# Patient Record
Sex: Female | Born: 1960 | Race: White | Hispanic: No | Marital: Married | Smoking: Never smoker
Health system: Southern US, Community
[De-identification: ages and names within clinical notes are randomized; demographics above are authoritative.]

## PROBLEM LIST (undated history)

## (undated) DIAGNOSIS — K37 Unspecified appendicitis: Secondary | ICD-10-CM

## (undated) HISTORY — PX: APPENDECTOMY: SHX54

---

## 1997-08-23 ENCOUNTER — Other Ambulatory Visit: Admission: RE | Admit: 1997-08-23 | Discharge: 1997-08-23 | Payer: Self-pay | Admitting: Obstetrics and Gynecology

## 1999-02-26 ENCOUNTER — Encounter: Payer: Self-pay | Admitting: Family Medicine

## 1999-02-26 ENCOUNTER — Ambulatory Visit (HOSPITAL_COMMUNITY): Admission: RE | Admit: 1999-02-26 | Discharge: 1999-02-26 | Payer: Self-pay | Admitting: Family Medicine

## 1999-03-10 ENCOUNTER — Ambulatory Visit (HOSPITAL_COMMUNITY): Admission: RE | Admit: 1999-03-10 | Discharge: 1999-03-10 | Payer: Self-pay | Admitting: *Deleted

## 1999-03-12 ENCOUNTER — Ambulatory Visit (HOSPITAL_COMMUNITY): Admission: RE | Admit: 1999-03-12 | Discharge: 1999-03-12 | Payer: Self-pay | Admitting: Neurology

## 1999-11-20 ENCOUNTER — Other Ambulatory Visit: Admission: RE | Admit: 1999-11-20 | Discharge: 1999-11-20 | Payer: Self-pay | Admitting: Obstetrics and Gynecology

## 2001-04-27 ENCOUNTER — Other Ambulatory Visit: Admission: RE | Admit: 2001-04-27 | Discharge: 2001-04-27 | Payer: Self-pay | Admitting: Obstetrics and Gynecology

## 2003-09-08 ENCOUNTER — Other Ambulatory Visit: Admission: RE | Admit: 2003-09-08 | Discharge: 2003-09-08 | Payer: Self-pay | Admitting: Obstetrics and Gynecology

## 2003-09-12 ENCOUNTER — Ambulatory Visit (HOSPITAL_COMMUNITY): Admission: RE | Admit: 2003-09-12 | Discharge: 2003-09-12 | Payer: Self-pay | Admitting: Neurology

## 2005-04-05 ENCOUNTER — Other Ambulatory Visit: Admission: RE | Admit: 2005-04-05 | Discharge: 2005-04-05 | Payer: Self-pay | Admitting: Obstetrics and Gynecology

## 2005-08-21 ENCOUNTER — Encounter: Payer: Self-pay | Admitting: *Deleted

## 2008-11-06 ENCOUNTER — Emergency Department (HOSPITAL_COMMUNITY): Admission: EM | Admit: 2008-11-06 | Discharge: 2008-11-07 | Payer: Self-pay | Admitting: Emergency Medicine

## 2008-11-09 ENCOUNTER — Ambulatory Visit: Payer: Self-pay | Admitting: Internal Medicine

## 2008-11-09 DIAGNOSIS — R1031 Right lower quadrant pain: Secondary | ICD-10-CM

## 2008-11-11 ENCOUNTER — Telehealth: Payer: Self-pay | Admitting: Internal Medicine

## 2008-11-11 ENCOUNTER — Encounter: Payer: Self-pay | Admitting: Internal Medicine

## 2008-11-11 LAB — CONVERTED CEMR LAB
Basophils Relative: 3 % (ref 0.0–3.0)
Eosinophils Absolute: 0.1 10*3/uL (ref 0.0–0.7)
HCT: 35.7 % — ABNORMAL LOW (ref 36.0–46.0)
Lymphs Abs: 1.2 10*3/uL (ref 0.7–4.0)
MCHC: 34.2 g/dL (ref 30.0–36.0)
MCV: 84.5 fL (ref 78.0–100.0)
Monocytes Absolute: 0.4 10*3/uL (ref 0.1–1.0)
Neutrophils Relative %: 81.7 % — ABNORMAL HIGH (ref 43.0–77.0)
Platelets: 220 10*3/uL (ref 150.0–400.0)
RBC: 4.22 M/uL (ref 3.87–5.11)

## 2008-11-14 ENCOUNTER — Encounter (INDEPENDENT_AMBULATORY_CARE_PROVIDER_SITE_OTHER): Payer: Self-pay | Admitting: General Surgery

## 2008-11-14 ENCOUNTER — Inpatient Hospital Stay (HOSPITAL_COMMUNITY): Admission: EM | Admit: 2008-11-14 | Discharge: 2008-11-18 | Payer: Self-pay | Admitting: Emergency Medicine

## 2008-12-05 ENCOUNTER — Telehealth: Payer: Self-pay | Admitting: Internal Medicine

## 2008-12-08 ENCOUNTER — Encounter: Payer: Self-pay | Admitting: Internal Medicine

## 2009-02-15 ENCOUNTER — Encounter: Admission: RE | Admit: 2009-02-15 | Discharge: 2009-02-15 | Payer: Self-pay | Admitting: Obstetrics and Gynecology

## 2010-02-23 ENCOUNTER — Encounter: Admission: RE | Admit: 2010-02-23 | Discharge: 2010-02-23 | Payer: Self-pay | Admitting: Obstetrics and Gynecology

## 2010-07-29 LAB — URINALYSIS, ROUTINE W REFLEX MICROSCOPIC
Bilirubin Urine: NEGATIVE
Glucose, UA: NEGATIVE mg/dL
Glucose, UA: NEGATIVE mg/dL
Hgb urine dipstick: NEGATIVE
Ketones, ur: NEGATIVE mg/dL
Protein, ur: 100 mg/dL — AB
Specific Gravity, Urine: 1.021 (ref 1.005–1.030)
Specific Gravity, Urine: 1.026 (ref 1.005–1.030)
Urobilinogen, UA: 2 mg/dL — ABNORMAL HIGH (ref 0.0–1.0)
pH: 5.5 (ref 5.0–8.0)

## 2010-07-29 LAB — DIFFERENTIAL
Eosinophils Absolute: 0.2 10*3/uL (ref 0.0–0.7)
Eosinophils Relative: 2 % (ref 0–5)
Lymphocytes Relative: 25 % (ref 12–46)
Lymphocytes Relative: 5 % — ABNORMAL LOW (ref 12–46)
Lymphs Abs: 0.7 10*3/uL (ref 0.7–4.0)
Lymphs Abs: 2.5 10*3/uL (ref 0.7–4.0)
Monocytes Absolute: 0.7 10*3/uL (ref 0.1–1.0)
Monocytes Relative: 5 % (ref 3–12)
Monocytes Relative: 7 % (ref 3–12)
Neutro Abs: 12.6 10*3/uL — ABNORMAL HIGH (ref 1.7–7.7)
Neutrophils Relative %: 90 % — ABNORMAL HIGH (ref 43–77)

## 2010-07-29 LAB — ANAEROBIC CULTURE

## 2010-07-29 LAB — LIPASE, BLOOD: Lipase: 14 U/L (ref 11–59)

## 2010-07-29 LAB — COMPREHENSIVE METABOLIC PANEL
ALT: 14 U/L (ref 0–35)
AST: 23 U/L (ref 0–37)
Albumin: 3.7 g/dL (ref 3.5–5.2)
CO2: 22 mEq/L (ref 19–32)
CO2: 26 mEq/L (ref 19–32)
Calcium: 8.4 mg/dL (ref 8.4–10.5)
Calcium: 8.9 mg/dL (ref 8.4–10.5)
Creatinine, Ser: 0.89 mg/dL (ref 0.4–1.2)
GFR calc Af Amer: >60 mL/min (ref >60)
GFR calc non Af Amer: >60 mL/min (ref >60)
GFR calc non Af Amer: >60 mL/min (ref >60)
Glucose, Bld: 112 mg/dL — ABNORMAL HIGH (ref 70–99)
Sodium: 137 mEq/L (ref 135–145)
Sodium: 138 mEq/L (ref 135–145)
Total Protein: 6.6 g/dL (ref 6.0–8.3)
Total Protein: 6.9 g/dL (ref 6.0–8.3)

## 2010-07-29 LAB — URINE MICROSCOPIC-ADD ON

## 2010-07-29 LAB — CBC
Hemoglobin: 14.2 g/dL (ref 12.0–15.0)
MCHC: 32.2 g/dL (ref 30.0–36.0)
MCHC: 33.2 g/dL (ref 30.0–36.0)
MCV: 85.3 fL (ref 78.0–100.0)
Platelets: 228 10*3/uL (ref 150–400)
RBC: 4.6 MIL/uL (ref 3.87–5.11)
RBC: 5.18 MIL/uL — ABNORMAL HIGH (ref 3.87–5.11)
RDW: 14.6 % (ref 11.5–15.5)

## 2010-07-29 LAB — WOUND CULTURE

## 2010-09-04 NOTE — Op Note (Signed)
NAMECHEVELLE, Dorothy Rhodes              ACCOUNT NO.:  [id number]   MEDICAL RECORD NO.:  [id number]          PATIENT TYPE:  INP   LOCATION:  0098                         FACILITY:  [facility]   PHYSICIAN:  Angelia Mould. Derrell Lolling, M.D.DATE OF BIRTH:  1960/05/16   DATE OF PROCEDURE:  11/14/2008  DATE OF DISCHARGE:                               OPERATIVE REPORT   PREOPERATIVE DIAGNOSES:  Ruptured appendicitis with peritonitis.   POSTOPERATIVE DIAGNOSES:  Ruptured appendicitis with peritonitis,  periappendiceal abscess.   OPERATION PERFORMED:  Laparoscopic appendectomy, drainage of  periappendiceal abscess.   SURGEON:  Dr. Claud Kelp.   OPERATIVE INDICATIONS:  This is a healthy 50 year old Caucasian female  who has had abdominal pain for 7 days.  She had seen three different  physicians during that period of time.  She has been on antibiotics for  a presumed urinary tract infection for 48 hours and her mid and lower  abdominal pain has gotten worse.  She came to the emergency room and a  CT scan was done which showed extensive ascites and a pelvic  inflammatory process in the right lower quadrant.  There was too much  inflammation for the radiologist to make a clear-cut diagnosis, but they  were suspicious of ruptured appendicitis.  On exam, she has tenderness  and guarding throughout the entire lower abdomen, some tachycardia and a  white blood cell count of 14,000.  She is brought to the operating room  urgently.   OPERATIVE FINDINGS:  The patient had acute ruptured appendicitis with  peritonitis.  She had extensive ascites throughout the entire abdomen  with cloudy brownish green fluid which was cultured.  Once we had  separated the loops of terminal ileum away from the appendix, there was  a small abscess with tan creamy pus in the area.  This abscess cavity  was not well formed, but a clear-cut walled off abscess.  The terminal  ileum looked normal for about the last 3-4 feet other than  some  secondary inflammation.  The uterus was slightly enlarged and had some  small fibroids in the wall.  Both tubes and ovaries looked fine.  The  sigmoid colon looked fine.  The liver and gallbladder looked fine.  The  stomach looked fine.  The spleen looked fine.   OPERATIVE TECHNIQUE:  Following the induction of general endotracheal  anesthesia, a Foley catheter was inserted.  Intravenous antibiotics had  been given prior to induction of anesthesia which included gentamicin  and cefoxitin.  The abdomen was prepped and draped in a sterile fashion.  A surgical timeout was held identifying the correct patient and correct  procedure.  Marcaine 0.5% with epinephrine was used as a local  infiltration anesthetic.   A vertically oriented incision was made in the upper rim of the  umbilicus in the midline.  The fascia was incised in the midline and the  abdominal cavity entered under direct vision.  An 11-mm Hassan trocar  was inserted and secured with a pursestring suture of 0 Vicryl.  Pneumoperitoneum was created.  The patient was positioned in  Trendelenburg.  A 12-mm  trocar was placed in the left suprapubic area  and a 5-mm trocar placed in the left lower quadrant and ultimately I had  to place a 5-mm trocar in the right upper quadrant for exposure and  irrigation.   We suctioned out pelvic fluid and fluid in the subphrenic space and  paracolic space.  We slowly teased loops of the terminal ileum away from  the cecum until we could see part of the inflamed appendix.  We very  gently teased the small bowel away and we ultimately entered an  abscessed cavity with tan creamy pus which was evacuated.  We looked at  the rest of the terminal ileum and saw no other primary disease process.  The uterus, fallopian tubes and ovaries looked fine.  We directed our  attention back to the cecum, terminal ileum and inflamed appendix.  As  we mobilized the appendix it became more clear of the  anatomy.  We  slowly teased the last bit of the terminal ileum away from the appendix  and then we could slowly began to dissect the mesentery of the appendix.  We isolated small bits of the appendiceal mesentery by going around it  with a right angle clamp and then dividing with harmonic scalpel.  We  did this in numerous steps until we almost had the appendix completely  skeletonized.  We found the appendiceal artery near the base of the  appendix and we got around that and divided it with the harmonic scalpel  as well.  At this point, we could see the entire appendix and where it  inserted onto the base of the cecum.  There did not appear to be any  injury to the cecum or the terminal ileum.  We placed an Endo-GIA  stapler across the base of the appendix with a 3.5 mm staple load.  After closing this, we held it in place for about 30 seconds, fired and  removed it.  The appendix was placed in a specimen bag and removed.  We  inspected the staple line several times and it looked very good.   We then spent a good amount of time irrigating about 3000 mL of fluid in  the pelvis, paracolic gutters, between loops of small bowel and the  subphrenic spaces and the subhepatic space.  We did this until the fluid  was completely clear.  The small bowel loops were completely free.  We  did not feel like we had left any loops adherent to each other.  We got  all the fluid out.  We checked the appendix closure one more time and it  looked good.  We brought the omentum down and tucked it onto the staple  line.  We evacuated the rest of the fluids.  We released the  pneumoperitoneum and removed all the trocars.  The fascia at the  umbilicus and the fascia at the suprapubic 12-mm trocar site were closed  with 0 Vicryl sutures.  The skin incisions were closed with subcuticular  sutures of 4-0 Monocryl and Steri-Strips.  Clean bandages were placed  and the patient taken to the recovery room in stable  condition.  Estimated blood loss was about 15 mL.  Complications none.  Sponge,  needle and instrument counts were correct.      Angelia Mould. Derrell Lolling, M.D.  Electronically Signed     HMI/MEDQ  D:  11/14/2008  T:  11/14/2008  Job:  [id number]   cc:   Miguel Aschoff, M.D.

## 2010-09-04 NOTE — H&P (Signed)
Dorothy Rhodes, Dorothy Rhodes              ACCOUNT NO.:  [id number]   MEDICAL RECORD NO.:  [id number]          PATIENT TYPE:  INP   LOCATION:  0098                         FACILITY:  [facility]   PHYSICIAN:  Angelia Mould. Derrell Lolling, M.D.DATE OF BIRTH:  1960-08-31   DATE OF ADMISSION:  11/14/2008  DATE OF DISCHARGE:                              HISTORY & PHYSICAL   CHIEF COMPLAINT:  Abdominal pain.   HISTORY OF PRESENT ILLNESS:  This is a healthy 50 year old Caucasian  female who was well until 7 days ago.  At that time, she developed some  periumbilical crampy gas pains.  She took Gas-X, it did not help.  She  went to the [facility] ER on November 07, 2008, complaining of the pain,  anorexia and nausea.  She denied vomiting or diarrhea, but had, had a  little bit of a low grade fever.  She says they told her she had an  ulcer and gave her a prescription for Prilosec and Phenergan.   The pain, anorexia and mild nausea continued.  She says she has had low-  grade fevers off and on and has had some chills.  She saw Dr. Stan Head of [facility] on Wednesday, November 09, 2008.  He initially thought  she had some type of musculoskeletal complaint, but she says that he  told her she had a slightly elevated white blood cell count.  He gave  her a prescription for hydrocodone and naproxen.   Her symptoms continued and on Friday, November 11, 2008, she thought she saw  some blood in her urine and she went to [facility].  She says  they gave her a prescription for Cipro and told her she had a urinary  tract infection.   Today, the pain persists, is more in the lower abdomen.  She says more  right than left-sided, but actually hurts all the way across.  It is  steady in Editor, commissioning.  She had a bowel movement yesterday.  She had no  prior similar episodes.   She came to the [facility] where they found that  her white blood cell count was 14,000.  CT scan showed an inflammatory  pelvic  process, possibly enlarged appendix, extensive ascites, uterine  fibroids, possibility of ruptured appendicitis was stated.  It should be  noted she had an ultrasound of the abdomen on November 06, 2008, which  showed only a cystic process in the liver.  She is admitted for further  evaluation and management.   PAST HISTORY:  Diagnosed multiple sclerosis on MRI a few years ago.  She  is asymptomatic, initially saw Dr. Lesia Sago and initially treated  but on no medications for 5-6 years.  Last menstrual period was normal  on October 27, 2008.  She has not had any prior medical or surgical  problems otherwise.  Fibroids are noted on CT tonight.   CURRENT MEDICATIONS:  She is on no chronic medications, but recently has  taken hydrocodone, Cipro, naproxen, Prilosec and Zofran.   ALLERGIES:  NONE KNOWN.   SOCIAL HISTORY:  Married  with two children.  Denies tobacco.  Drinks  alcohol rarely.  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   FAMILY HISTORY:  Mother living with hypertension, diabetes and coronary  artery disease.  Father living with hypertension and kidney stones.  Grandfather had colon cancer.  There is specifically no family history  of Crohn's disease, ulcerative colitis or chronic GI disease.   REVIEW OF SYSTEMS:  A 10-system review of systems is performed and is  noncontributory except as described above.   PHYSICAL EXAMINATION:  GENERAL:  A healthy-appearing pleasant woman in  mild distress.  VITAL SIGNS:  Temperature 98.7, respiratory rate 16, blood pressure  110/79.  HEENT:  Eyes - sclerae clear.  Extraocular movements intact.  Ears,  nose, mouth, throat, nose, lips, tongue and oropharynx are without gross  lesions.  NECK:  Supple, nontender.  No mass, no thyromegaly, no jugulovenous  distention.  LUNGS:  Clear to auscultation.  No chest wall tenderness.  No CVA  tenderness.  HEART:  Regular rate and rhythm.  Slightly tachycardic.  There may be a   very faint systolic murmur.  Radial and femoral pulses are palpable.  BREASTS:  Not examined.  ABDOMEN:  Perhaps slightly distended in the lower abdomen, very  hypoactive bowel sounds.  Upper abdomen is fairly soft, but the entire  lower abdomen is tender with involuntary guarding.  I do not see any  scars.  I do not see any hernias.  I do not feel any masses.  There is  no groin mass or adenopathy.  EXTREMITIES:  She moves all four  extremities without pain or deformity.  NEUROLOGIC:  No gross motor or sensory deficits.   ASSESSMENT:  1. Acute abdominal pain.  Most likely diagnosis is ruptured      appendicitis with lower abdominal peritonitis.  Other      considerations would be Meckel's diverticulitis, sigmoid      diverticulitis, other inflammatory condition of the small bowel or      cecum.  I think she needs urgent operative intervention for      diagnosis and treatment.  2. Asymptomatic multiple sclerosis.   PLAN:  1. The patient is started on IV fluids and IV antibiotics.  2. She will be taken to the operating room for laparoscopy and      possible laparotomy for management and diagnosis.   I explained to her that we might have to make a big incision.  I  explained to her that there was a remote possibility we might have to do  a colostomy.   I discussed the indication and details of the surgery with her and her  husband.  The risks and complications have been outlined, including but  not limited to bleeding, infection, open laparotomy, wound problems such  as infection or hernia, injury to adjacent organs such as the intestine  or bladder with major reconstructive surgery, cardiac, pulmonary and  thromboembolic problems.  She seems to understand these issues well.  At  this time, all questions are answered.  She is in full agreement with  this plan.      Angelia Mould. Derrell Lolling, M.D.  Electronically Signed     HMI/MEDQ  D:  11/14/2008  T:  11/14/2008  Job:  [id number]    cc:   Miguel Aschoff, M.D.

## 2010-09-07 NOTE — Discharge Summary (Signed)
Dorothy Rhodes, Dorothy Rhodes              ACCOUNT NO.:  [id number]   MEDICAL RECORD NO.:  [id number]           PATIENT TYPE:   LOCATION:                                 FACILITY:   PHYSICIAN:  Angelia Mould. Derrell Lolling, M.D.DATE OF BIRTH:  1961/01/21   DATE OF ADMISSION:  11/14/2008  DATE OF DISCHARGE:  11/18/2008                               DISCHARGE SUMMARY   FINAL DIAGNOSES:  1. Acute appendicitis.  2. Postoperative ileus, prolonged.   OPERATIONS PERFORMED:  1. Laparoscopic appendectomy.  2. Laparoscopic drainage of periappendiceal abscess.   DATE OF SURGERY:  November 14, 2008.   HISTORY:  This is a 50 year old Caucasian female who developed abdominal  symptoms 7 days prior to this admission.  She had crampy pain, took Gas-  X, did not improve.  She was seen in the [facility] emergency room on  July 19th.  She was treated for presumed peptic ulcer disease.  Her pain  and nausea continued.  She saw Dr. Stan Head, of [facility], on July  21st, noted that she had a slightly elevated white blood cell count and  was treated, according to the patient, for her musculoskeletal  complaints.  Her symptoms continued.  On Friday, July 23rd, she thought  she saw blood in her urine and went to [facility] and was  given a prescription for Cipro and told she had a urinary tract  infection, according to the patient.  She came to the [facility]  emergency room on the day of admission, says her pain is more right-  sided than left-sided.  White blood cell count was noted be 14,000 and a  CT scan showed an inflammatory pelvic process, possibly enlarged  appendix, ascites, I was called to evaluate her at that point.   HOSPITAL COURSE:  I evaluated the patient in the emergency room.  I felt  that her abdomen was slightly distended and tender in the lower abdomen  with involuntary guarding, no mass was felt, no hernias were felt.  I  was unsure of the diagnosis but felt that acute appendicitis,  possibly  with rupture, was the most likely diagnosis.  I felt that it was unwise  to treat her nonoperatively and so she was started on IV fluids, IV  antibiotics and taken to the operating room.   In the operating room, she underwent diagnostic laparoscopy.  I found  that she had ruptured appendicitis with abscess.  We were able to do a  laparoscopic appendectomy and drain a small abscess.  She was noted to  have peritonitis.  She underwent extensive peritoneal lavage with 3 or 4  liters of saline.   Postoperatively, the patient did well.  She had an ileus for several  days as expected.  Her pathology report showed acute appendicitis.  Her  ileus began to resolve by postop day #3 and she was hungry and had a  bowel movement and we advanced her activities and diet at that point.   On July 30th she was doing well and tolerated diet, having bowel  movements, ambulating in the hall  and then wanted to go home, her wounds  looked fine and her abdomen was fairly soft.  She was given a  prescription for Vicodin for pain and given a prescription for Augmentin  875 mg p.o. b.i.d. times 7 days.  She was asked to return to see me in  the office in 2-3 weeks.      Angelia Mould. Derrell Lolling, M.D.  Electronically Signed     HMI/MEDQ  D:  11/28/2008  T:  11/28/2008  Job:  [id number]   cc:   C. Duane Lope, M.D.  Fax: [phone]   Iva Boop, MD,FACG  [facility]  [address] [place], [place] [address]

## 2011-02-12 ENCOUNTER — Other Ambulatory Visit: Payer: Self-pay | Admitting: Obstetrics and Gynecology

## 2013-03-16 ENCOUNTER — Other Ambulatory Visit: Payer: Self-pay | Admitting: Obstetrics and Gynecology

## 2013-09-08 ENCOUNTER — Other Ambulatory Visit: Payer: Self-pay

## 2014-01-28 ENCOUNTER — Emergency Department (HOSPITAL_COMMUNITY)
Admission: EM | Admit: 2014-01-28 | Discharge: 2014-01-29 | Disposition: A | Discharge status: Home / Self Care | Payer: BC Managed Care – PPO | Attending: Emergency Medicine | Admitting: Emergency Medicine

## 2014-01-28 DIAGNOSIS — Z8719 Personal history of other diseases of the digestive system: Secondary | ICD-10-CM | POA: Diagnosis not present

## 2014-01-28 DIAGNOSIS — Y9389 Activity, other specified: Secondary | ICD-10-CM | POA: Insufficient documentation

## 2014-01-28 DIAGNOSIS — S60861A Insect bite (nonvenomous) of right wrist, initial encounter: Secondary | ICD-10-CM | POA: Insufficient documentation

## 2014-01-28 DIAGNOSIS — W57XXXA Bitten or stung by nonvenomous insect and other nonvenomous arthropods, initial encounter: Secondary | ICD-10-CM | POA: Insufficient documentation

## 2014-01-28 DIAGNOSIS — Y92009 Unspecified place in unspecified non-institutional (private) residence as the place of occurrence of the external cause: Secondary | ICD-10-CM | POA: Diagnosis not present

## 2014-01-28 DIAGNOSIS — Z79899 Other long term (current) drug therapy: Secondary | ICD-10-CM | POA: Insufficient documentation

## 2014-01-28 HISTORY — DX: Unspecified appendicitis: K37

## 2014-01-29 ENCOUNTER — Encounter (HOSPITAL_COMMUNITY): Payer: Self-pay | Admitting: Emergency Medicine

## 2014-01-29 MED ORDER — PREDNISONE 20 MG PO TABS: 40.0000 mg | ORAL_TABLET | Freq: Every day | ORAL | Status: AC

## 2014-01-29 NOTE — Discharge Instructions (Signed)

## 2014-01-29 NOTE — ED Notes (Signed)
Pt states she was taking out the trash and felt a bite on her rt distal forearm. Pt states the arm began to swell immediately and site has ecchymosis upon assessment. Pt states she is also having some numbness to the arm and hand.

## 2014-01-29 NOTE — ED Provider Notes (Signed)
Medical screening examination/treatment/procedure(s) were performed by non-physician practitioner and as supervising physician I was immediately available for consultation/collaboration.   EKG Interpretation None       Duha Abair K Amando Chaput-Rasch, MD 01/29/14 [phone]

## 2014-01-29 NOTE — ED Provider Notes (Signed)
CSN: [id number]     Arrival date & time 01/28/14  2347 History   First MD Initiated Contact with Patient 01/29/14 0007     Chief Complaint  Patient presents with  . Insect Bite    (Consider location/radiation/quality/duration/timing/severity/associated sxs/prior Treatment) HPI Comments: 53 year old female presents to the emergency department for further evaluation of pain to her distal right forearm. Patient states that she was taking out the trash in her home when she felt a sharp stinging, nonradiating pain on the volar aspect of her right wrist; she believes she was bit by something, but did not see anything bite her. Patient states that she noticed a punctate area of erythema as well as surrounding soft tissue swelling. Patient took some Benadryl for symptoms which she states helped her a bit. She also states she has noticed some subjective numbness to her right hand. Patient denies complete loss of sensation in her hand as well as heat to touch, red linear streaking, decreased range of motion of her right wrist, and associated fever.  The history is provided by the patient. No language interpreter was used.    Past Medical History  Diagnosis Date  . Appendicitis    Past Surgical History  Procedure Laterality Date  . Appendectomy     No family history on file. History  Substance Use Topics  . Smoking status: Never Smoker   . Smokeless tobacco: Never Used  . Alcohol Use: Yes     Comment: occasionally   OB History   Grav Para Term Preterm Abortions TAB SAB Ect Mult Living                  Review of Systems  Constitutional: Negative for fever.  Musculoskeletal: Positive for myalgias. Negative for arthralgias.  Skin: Positive for color change.  Neurological: Positive for numbness. Negative for syncope and weakness.  All other systems reviewed and are negative.   Allergies  Review of patient's allergies indicates no known allergies.  Home Medications   Prior to Admission  medications   Medication Sig Start Date End Date Taking? Authorizing Provider  diphenhydrAMINE (BENADRYL) 12.5 MG/5ML liquid Take 25 mg by mouth 4 (four) times daily as needed for allergies.   Yes Historical Provider, MD  Estradiol (VAGIFEM) 10 MCG TABS vaginal tablet Place 1 tablet vaginally 2 (two) times a week.   Yes Historical Provider, MD  Multiple Vitamin (MULTIVITAMIN WITH MINERALS) TABS tablet Take 1 tablet by mouth daily.   Yes Historical Provider, MD  zolpidem (AMBIEN) 10 MG tablet Take 5 mg by mouth at bedtime as needed for sleep.   Yes Historical Provider, MD  predniSONE (DELTASONE) 20 MG tablet Take 2 tablets (40 mg total) by mouth daily. 01/29/14   Antony MaduraKelly Khloee Garza, PA-C   BP 110/74  Pulse 69  Temp(Src) 98.2 F (36.8 C) (Oral)  Resp 16  Ht 5\' 6"  (1.676 m)  Wt 130 lb (58.968 kg)  BMI 20.99 kg/m2  SpO2 99%  Physical Exam  Nursing note and vitals reviewed. Constitutional: She is oriented to person, place, and time. She appears well-developed and well-nourished. No distress.  Nontoxic and nonseptic appearing  HENT:  Head: Normocephalic and atraumatic.  Eyes: Conjunctivae and EOM are normal. No scleral icterus.  Neck: Normal range of motion.  Cardiovascular: Normal rate, regular rhythm and intact distal pulses.   Distal radial pulse 2+ in right upper extremity  Pulmonary/Chest: Effort normal. No respiratory distress.  Musculoskeletal: Normal range of motion. She exhibits tenderness.  Right wrist: She exhibits tenderness and swelling (TTP to volar aspect of R wrist with mild associated soft tissue swelling). She exhibits normal range of motion, no bony tenderness, no effusion, no crepitus and no deformity.       Right hand: Normal.       Hands: Neurological: She is alert and oriented to person, place, and time. She exhibits normal muscle tone. Coordination normal.  Sensation to light touch intact in right upper extremity, thought patient does have difficulty differentiating  between sharp and dull touch on exam. Finger to thumb opposition intact.  Skin: Skin is warm and dry. No rash noted. She is not diaphoretic. No erythema. No pallor.  Psychiatric: She has a normal mood and affect. Her behavior is normal.    ED Course  Procedures (including critical care time) Labs Review Labs Reviewed - No data to display  Imaging Review No results found.   EKG Interpretation None      MDM   Final diagnoses:  Bug bite without infection    53 year old female presents to the emergency department for further evaluation of right wrist pain after being potentially bit by something while taking out the trash. Patient did not see anything bite her. She is neurovascularly intact on exam. Physical exam findings suggestive of a localized reaction from, likely, bug bite. No evidence of secondary infection. Patient given instructions on supportive treatment. Will also prescribe five-day course of prednisone to assist with swelling. Patient advised to follow up with her primary doctor to ensure resolution of symptoms. Return precautions provided and patient agreeable to plan with no unaddressed concerns.   Filed Vitals:   01/29/14 0002 01/29/14 0118  BP: 114/83 110/74  Pulse: 62 69  Temp: 97.9 F (36.6 C) 98.2 F (36.8 C)  TempSrc: Oral Oral  Resp: 16 16  Height: 5\' 6"  (1.676 m)   Weight: 130 lb (58.968 kg)   SpO2: 100% 99%     Antony MaduraKelly Kaylynn Chamblin, PA-C 01/29/14 0820

## 2014-03-23 ENCOUNTER — Other Ambulatory Visit: Payer: Self-pay | Admitting: Obstetrics and Gynecology

## 2014-03-24 LAB — CYTOLOGY - PAP

## 2014-07-08 ENCOUNTER — Other Ambulatory Visit: Payer: Self-pay | Admitting: Obstetrics and Gynecology

## 2014-07-24 ENCOUNTER — Ambulatory Visit (INDEPENDENT_AMBULATORY_CARE_PROVIDER_SITE_OTHER): Payer: BLUE CROSS/BLUE SHIELD | Admitting: Emergency Medicine

## 2014-07-24 VITALS — BP 110/60 | HR 69 | Temp 97.5°F | Resp 20 | Ht 66.0 in | Wt 134.0 lb

## 2014-07-24 DIAGNOSIS — M436 Torticollis: Secondary | ICD-10-CM

## 2014-07-24 MED ORDER — NAPROXEN SODIUM 550 MG PO TABS: 550.0000 mg | ORAL_TABLET | Freq: Two times a day (BID) | ORAL | Status: AC

## 2014-07-24 MED ORDER — HYDROCODONE-ACETAMINOPHEN 5-325 MG PO TABS: 1.0000 | ORAL_TABLET | ORAL | Status: AC | PRN

## 2014-07-24 MED ORDER — CYCLOBENZAPRINE HCL 10 MG PO TABS: 10.0000 mg | ORAL_TABLET | Freq: Three times a day (TID) | ORAL | Status: AC | PRN

## 2014-07-24 NOTE — Patient Instructions (Signed)

## 2014-07-24 NOTE — Progress Notes (Signed)
[facility] [address], [place] [id number] [phone]  Date:  07/24/2014   Name:  Dorothy Rhodes   DOB:  1960/08/06   MRN:  [id number]  PCP:  No primary care provider on file.    Chief Complaint: Neck Pain   History of Present Illness:  Dorothy Rhodes MyrtleM Zwilling is a 54 y.o. very pleasant female patient who presents with the following:  Awoke yesterday with pain in the left side of her neck No history of injury or overuse No radiation of pain  No neuro symptoms History of prior neck pain similar Works on a computer No improvement with over the counter medications or other home remedies.  Denies other complaint or health concern today.   Patient Active Problem List   Diagnosis Date Noted  . ABDOMINAL PAIN-RLQ 11/09/2008    Past Medical History  Diagnosis Date  . Appendicitis     Past Surgical History  Procedure Laterality Date  . Appendectomy      History  Substance Use Topics  . Smoking status: Never Smoker   . Smokeless tobacco: Never Used  . Alcohol Use: 0.0 oz/week    0 Standard drinks or equivalent per week     Comment: occasionally    Family History  Problem Relation Age of Onset  . Diabetes Mother   . Diabetes Brother     No Known Allergies  Medication list has been reviewed and updated.  Current Outpatient Prescriptions on File Prior to Visit  Medication Sig Dispense Refill  . Estradiol (VAGIFEM) 10 MCG TABS vaginal tablet Place 1 tablet vaginally 2 (two) times a week.    . zolpidem (AMBIEN) 10 MG tablet Take 5 mg by mouth at bedtime as needed for sleep.    . diphenhydrAMINE (BENADRYL) 12.5 MG/5ML liquid Take 25 mg by mouth 4 (four) times daily as needed for allergies.    . Multiple Vitamin (MULTIVITAMIN WITH MINERALS) TABS tablet Take 1 tablet by mouth daily.    . predniSONE (DELTASONE) 20 MG tablet Take 2 tablets (40 mg total) by mouth daily. (Patient not taking: Reported on 07/24/2014) 10 tablet 0   No current  facility-administered medications on file prior to visit.    Review of Systems:  As per HPI, otherwise negative.    Physical Examination: Filed Vitals:   07/24/14 1127  BP: 110/60  Pulse: 69  Temp: 97.5 F (36.4 C)  Resp: 20   Filed Vitals:   07/24/14 1127  Height: 5\' 6"  (1.676 m)  Weight: 134 lb (60.782 kg)   Body mass index is 21.64 kg/(m^2). Ideal Body Weight: Weight in (lb) to have BMI = 25: 154.6  GEN: WDWN, NAD, Non-toxic, A & O x 3 HEENT: Atraumatic, Normocephalic. Neck supple. No masses, No LAD. Ears and Nose: No external deformity. Neck:  Tender left trapezius with spasm.  Head inclined to left CV: RRR, No M/G/R. No JVD. No thrill. No extra heart sounds. PULM: CTA B, no wheezes, crackles, rhonchi. No retractions. No resp. distress. No accessory muscle use. ABD: S, NT, ND, +BS. No rebound. No HSM. EXTR: No c/c/e NEURO Normal gait.  PSYCH: Normally interactive. Conversant. Not depressed or anxious appearing.  Calm demeanor.    Assessment and Plan: Torticollis Anaprox Flexeril norco  Signed,  Phillips OdorJeffery Dev Dhondt, MD

## 2014-08-30 ENCOUNTER — Other Ambulatory Visit: Payer: Self-pay | Admitting: Obstetrics and Gynecology

## 2015-04-26 ENCOUNTER — Other Ambulatory Visit: Payer: Self-pay | Admitting: Obstetrics and Gynecology

## 2015-04-27 LAB — CYTOLOGY - PAP

## 2016-04-30 ENCOUNTER — Other Ambulatory Visit: Payer: Self-pay | Admitting: Obstetrics and Gynecology

## 2016-05-01 LAB — CYTOLOGY - PAP

## 2017-08-21 ENCOUNTER — Encounter: Payer: Self-pay | Admitting: Obstetrics and Gynecology

## 2018-10-18 ENCOUNTER — Encounter (HOSPITAL_COMMUNITY): Payer: Self-pay

## 2018-10-18 ENCOUNTER — Other Ambulatory Visit: Payer: Self-pay

## 2018-10-18 ENCOUNTER — Emergency Department (HOSPITAL_COMMUNITY)
Admission: EM | Admit: 2018-10-18 | Discharge: 2018-10-18 | Disposition: A | Discharge status: Home / Self Care | Payer: 59 | Attending: Emergency Medicine | Admitting: Emergency Medicine

## 2018-10-18 ENCOUNTER — Emergency Department (HOSPITAL_COMMUNITY): Payer: 59

## 2018-10-18 DIAGNOSIS — R1010 Upper abdominal pain, unspecified: Secondary | ICD-10-CM | POA: Diagnosis not present

## 2018-10-18 DIAGNOSIS — R1013 Epigastric pain: Secondary | ICD-10-CM | POA: Diagnosis present

## 2018-10-18 DIAGNOSIS — Z79899 Other long term (current) drug therapy: Secondary | ICD-10-CM | POA: Diagnosis not present

## 2018-10-18 LAB — COMPREHENSIVE METABOLIC PANEL
ALT: 22 U/L (ref 0–44)
AST: 23 U/L (ref 15–41)
Albumin: 4.6 g/dL (ref 3.5–5.0)
Alkaline Phosphatase: 51 U/L (ref 38–126)
Anion gap: 10 (ref 5–15)
BUN: 12 mg/dL (ref 6–20)
CO2: 25 mmol/L (ref 22–32)
Calcium: 9 mg/dL (ref 8.9–10.3)
Chloride: 104 mmol/L (ref 98–111)
Creatinine, Ser: 0.71 mg/dL (ref 0.44–1.00)
GFR calc Af Amer: >60 mL/min (ref >60)
GFR calc non Af Amer: >60 mL/min (ref >60)
Glucose, Bld: 97 mg/dL (ref 70–99)
Potassium: 3.5 mmol/L (ref 3.5–5.1)
Sodium: 139 mmol/L (ref 135–145)
Total Bilirubin: 0.7 mg/dL (ref 0.3–1.2)
Total Protein: 7.4 g/dL (ref 6.5–8.1)

## 2018-10-18 LAB — URINALYSIS, ROUTINE W REFLEX MICROSCOPIC
Bilirubin Urine: NEGATIVE
Glucose, UA: NEGATIVE mg/dL
Hgb urine dipstick: NEGATIVE
Ketones, ur: 20 mg/dL — AB
Leukocytes,Ua: NEGATIVE
Nitrite: NEGATIVE
Protein, ur: NEGATIVE mg/dL
Specific Gravity, Urine: 1.011 (ref 1.005–1.030)
pH: 5 (ref 5.0–8.0)

## 2018-10-18 LAB — CBC
HCT: 44.4 % (ref 36.0–46.0)
Hemoglobin: 14.4 g/dL (ref 12.0–15.0)
MCH: 29 pg (ref 26.0–34.0)
MCHC: 32.4 g/dL (ref 30.0–36.0)
MCV: 89.3 fL (ref 80.0–100.0)
Platelets: 249 10*3/uL (ref 150–400)
RBC: 4.97 MIL/uL (ref 3.87–5.11)
RDW: 13.7 % (ref 11.5–15.5)
WBC: 5.6 10*3/uL (ref 4.0–10.5)
nRBC: 0 % (ref 0.0–0.2)

## 2018-10-18 LAB — LIPASE, BLOOD: Lipase: 41 U/L (ref 11–51)

## 2018-10-18 MED ORDER — SODIUM CHLORIDE 0.9% FLUSH: 3.0000 mL | Freq: Once | INTRAVENOUS | Status: DC

## 2018-10-18 NOTE — ED Notes (Signed)
Pt instructed to not eat or drink until exam by EDP

## 2018-10-18 NOTE — ED Provider Notes (Signed)
[name] DEPT Provider Note   CSN: [id number] Arrival date & time: 10/18/18  1441     History   Chief Complaint No chief complaint on file.   HPI Dorothy Rhodes is a 58 y.o. female.     HPI   58 year old female with abdominal pain.  Epigastric.  Onset earlier today.  Progressing.  Pain got to the point where she was doubled over and could barely walk.  Since being in the emergency room is actually subsided no fevers or chills.  No urinary complaints.  No vomiting or diarrhea.  Past Medical History:  Diagnosis Date  . Appendicitis     Patient Active Problem List   Diagnosis Date Noted  . ABDOMINAL PAIN-RLQ 11/09/2008    Past Surgical History:  Procedure Laterality Date  . APPENDECTOMY       OB History   No obstetric history on file.      Home Medications    Prior to Admission medications   Medication Sig Start Date End Date Taking? Authorizing Provider  aspirin 81 MG chewable tablet Chew 81 mg by mouth once.   Yes [provider]  estradiol (VIVELLE-DOT) 0.05 MG/24HR patch Place 1 patch onto the skin 2 (two) times a week. Saturday and Wed.   Yes [provider]  FLUoxetine (PROZAC) 10 MG tablet Take 10 mg by mouth daily.   Yes [provider]  ibuprofen (ADVIL) 200 MG tablet Take 400 mg by mouth every 6 (six) hours as needed for moderate pain.   Yes [provider]  Nutritional Supplements (JUICE PLUS FIBRE PO) Take 2 tablets by mouth daily.   Yes [provider]  progesterone (PROMETRIUM) 200 MG capsule Take 200 mg by mouth daily.   Yes [provider]  Simethicone (GAS-X PO) Take 2 tablets by mouth once.   Yes [provider]  zolpidem (AMBIEN) 10 MG tablet Take 5 mg by mouth at bedtime as needed for sleep.   Yes [provider]  cyclobenzaprine (FLEXERIL) 10 MG tablet Take 1 tablet (10 mg total) by mouth 3 (three) times daily as needed for muscle spasms.  Patient not taking: Reported on 10/18/2018 07/24/14   Roselee Culver, MD  HYDROcodone-acetaminophen [facility]) 5-325 MG per tablet Take 1-2 tablets by mouth every 4 (four) hours as needed. Patient not taking: Reported on 10/18/2018 07/24/14   Roselee Culver, MD  predniSONE (DELTASONE) 20 MG tablet Take 2 tablets (40 mg total) by mouth daily. Patient not taking: Reported on 07/24/2014 01/29/14   Antonietta Breach, PA-C    Family History Family History  Problem Relation Age of Onset  . Diabetes Mother   . Diabetes Brother     Social History Social History   Tobacco Use  . Smoking status: Never Smoker  . Smokeless tobacco: Never Used  Substance Use Topics  . Alcohol use: Yes    Alcohol/week: 0.0 standard drinks    Comment: occasionally  . Drug use: No     Allergies   Lactose intolerance (gi)   Review of Systems Review of Systems  All systems reviewed and negative, other than as noted in HPI.  Physical Exam Updated Vital Signs BP 116/78 (BP Location: Left Arm)   Pulse 69   Temp 98.2 F (36.8 C)   Resp 15   SpO2 98%   Physical Exam Vitals signs and nursing note reviewed.  Constitutional:      General: She is not in acute distress.  Appearance: She is well-developed.  HENT:     Head: Normocephalic and atraumatic.  Eyes:     General:        Right eye: No discharge.        Left eye: No discharge.     Conjunctiva/sclera: Conjunctivae normal.  Neck:     Musculoskeletal: Neck supple.  Cardiovascular:     Rate and Rhythm: Normal rate and regular rhythm.     Heart sounds: Normal heart sounds. No murmur. No friction rub. No gallop.   Pulmonary:     Effort: Pulmonary effort is normal. No respiratory distress.     Breath sounds: Normal breath sounds.  Abdominal:     General: There is no distension.     Palpations: Abdomen is soft.     Tenderness: There is no abdominal tenderness.  Musculoskeletal:        General: No tenderness.  Skin:    General: Skin is warm and  dry.  Neurological:     Mental Status: She is alert.  Psychiatric:        Behavior: Behavior normal.        Thought Content: Thought content normal.      ED Treatments / Results  Labs (all labs ordered are listed, but only abnormal results are displayed) Labs Reviewed  URINALYSIS, ROUTINE W REFLEX MICROSCOPIC - Abnormal; Notable for the following components:      Result Value   Ketones, ur 20 (*)    All other components within normal limits  LIPASE, BLOOD  COMPREHENSIVE METABOLIC PANEL  CBC    EKG    Radiology [place] Abdomen Limited  Result Date: 10/18/2018 CLINICAL DATA:  Upper abdominal pain epigastric pain EXAM: ULTRASOUND ABDOMEN LIMITED RIGHT UPPER QUADRANT COMPARISON:  None FINDINGS: Gallbladder: Normally distended without stones or wall thickening. No pericholecystic fluid or sonographic Murphy sign. Common bile duct: Diameter: 3 mm diameter, normal Liver: Normal echogenicity. Small cyst within LEFT lobe 19 x 16 x 15 mm containing a thin partial septation. No additional hepatic masses or nodularity. Portal vein is patent on color Doppler imaging with normal direction of blood flow towards the liver. No RIGHT upper quadrant free fluid. IMPRESSION: Minimally complicated cyst LEFT lobe liver 19 mm greatest size. Otherwise normal exam. Electronically Signed   By: Ulyses SouthwardMark  Boles M.D.   On: 10/18/2018 18:34    Procedures Procedures (including critical care time)  Medications Ordered in ED Medications - No data to display   Initial Impression / Assessment and Plan / ED Course  I have reviewed the triage vital signs and the nursing notes.  Pertinent labs & imaging results that were available during my care of the patient were reviewed by me and considered in my medical decision making (see chart for details).        58 year old female with abdominal pain.  Now actually resolved.  ED work-up unremarkable including ultrasound. Gastritis, peptic ulcer disease, biliary colic,  cholelithiasis, cholecystitis, cholangitis, hepatitis, renal colic, urinary tract infection, colitis, constipation, gastroenteritis, atypical ACS, mesenteric ischemia all considered among other etiologies in the patient's differential diagnosis.   It has been determined that no acute conditions requiring further emergency intervention are present at this time. The patient has been advised of the diagnosis and plan. I reviewed any labs and imaging including any potential incidental findings. I have reviewed nursing notes and appropriate previous records. We have discussed signs and symptoms that warrant return to the ED and they are listed in the discharge instructions.  Final Clinical Impressions(s) / ED Diagnoses   Final diagnoses:  Pain of upper abdomen    ED Discharge Orders    None       Raeford RazorKohut, Michaiah Holsopple, MD 10/18/18 2254

## 2018-10-18 NOTE — ED Notes (Signed)
Patient given discharge teaching and verbalized understanding. Patient ambulated out of ED with a steady gait. 

## 2021-02-05 ENCOUNTER — Other Ambulatory Visit: Payer: Self-pay | Admitting: Obstetrics and Gynecology

## 2021-02-05 DIAGNOSIS — Z1231 Encounter for screening mammogram for malignant neoplasm of breast: Secondary | ICD-10-CM

## 2021-03-09 ENCOUNTER — Ambulatory Visit
Admission: RE | Admit: 2021-03-09 | Discharge: 2021-03-09 | Disposition: A | Discharge status: Home / Self Care | Payer: 59 | Source: Ambulatory Visit | Attending: Obstetrics and Gynecology | Admitting: Obstetrics and Gynecology

## 2021-03-09 ENCOUNTER — Other Ambulatory Visit: Payer: Self-pay

## 2021-03-09 DIAGNOSIS — Z1231 Encounter for screening mammogram for malignant neoplasm of breast: Secondary | ICD-10-CM

## 2021-11-21 ENCOUNTER — Other Ambulatory Visit: Payer: Self-pay | Admitting: Obstetrics and Gynecology

## 2021-11-21 DIAGNOSIS — Z1231 Encounter for screening mammogram for malignant neoplasm of breast: Secondary | ICD-10-CM

## 2022-03-11 ENCOUNTER — Ambulatory Visit: Payer: 59

## 2022-04-05 ENCOUNTER — Ambulatory Visit
Admission: RE | Admit: 2022-04-05 | Discharge: 2022-04-05 | Disposition: A | Discharge status: Home / Self Care | Payer: 59 | Source: Ambulatory Visit | Attending: Obstetrics and Gynecology | Admitting: Obstetrics and Gynecology

## 2022-04-05 DIAGNOSIS — Z1231 Encounter for screening mammogram for malignant neoplasm of breast: Secondary | ICD-10-CM

## 2023-07-25 IMAGING — MG MM DIGITAL SCREENING BILAT W/ TOMO AND CAD
8 series · 9 of 24 positions shown · non-contrast
Comparison: Previous exam(s).

CLINICAL DATA: Screening.

EXAM:
DIGITAL SCREENING BILATERAL MAMMOGRAM WITH TOMOSYNTHESIS AND CAD
TECHNIQUE: Bilateral screening digital craniocaudal and mediolateral oblique
mammograms were obtained. Bilateral screening digital breast
tomosynthesis was performed. The images were evaluated with
computer-aided detection.

[R MLO synth-2D]
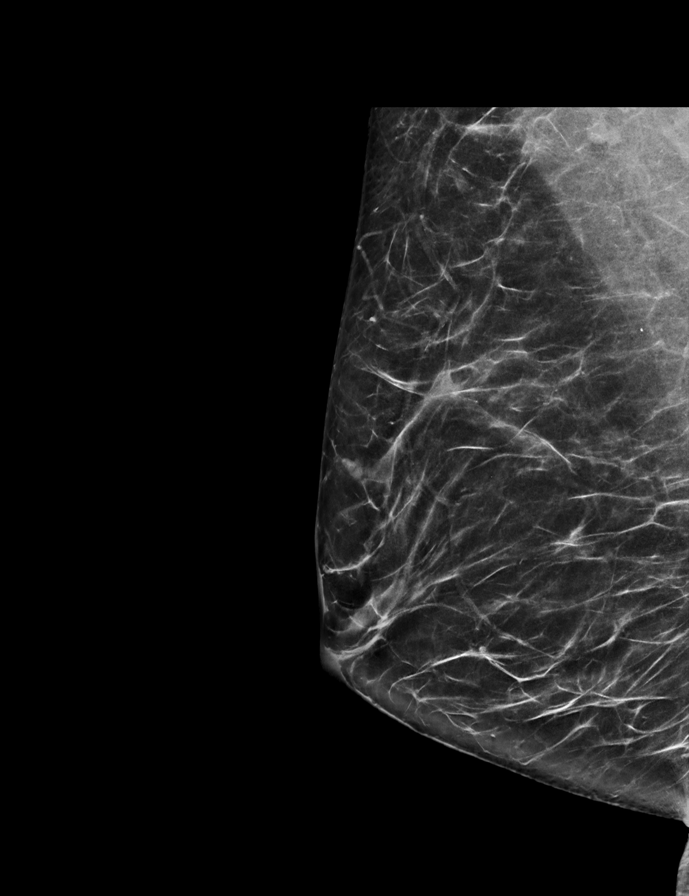

[L CC synth-2D]
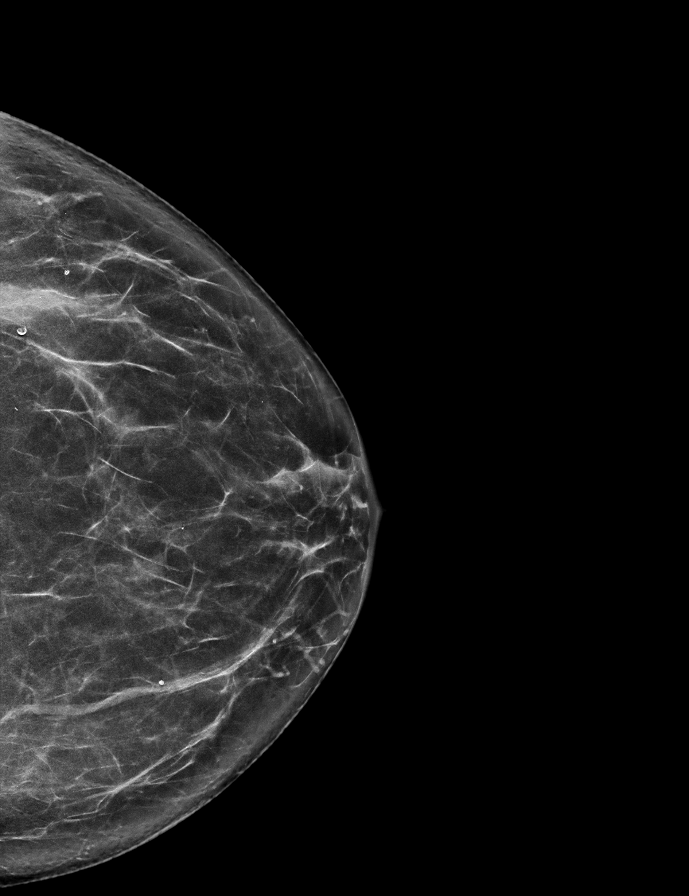

[L MLO synth-2D]
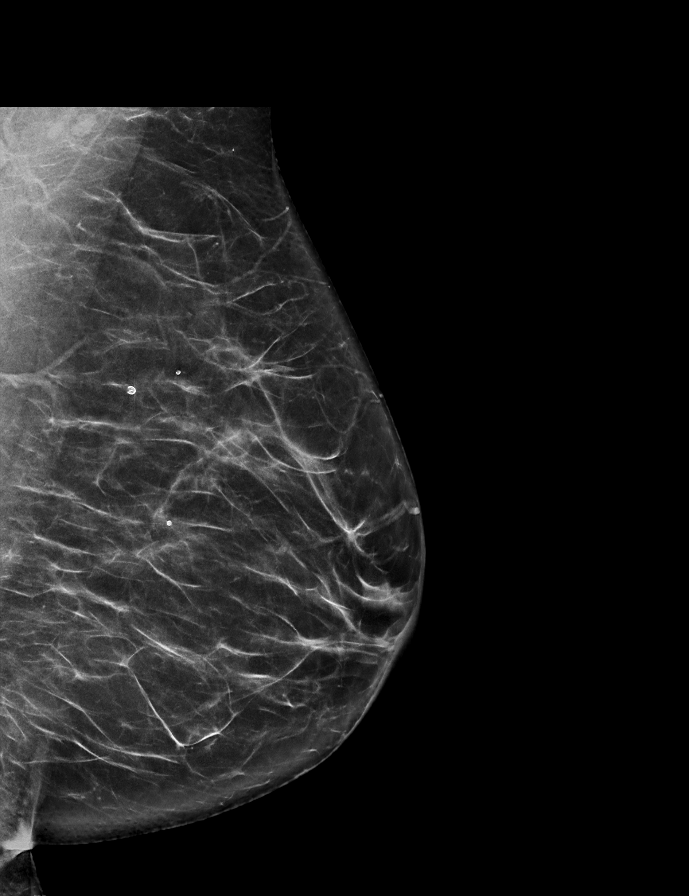

[R CC synth-2D]
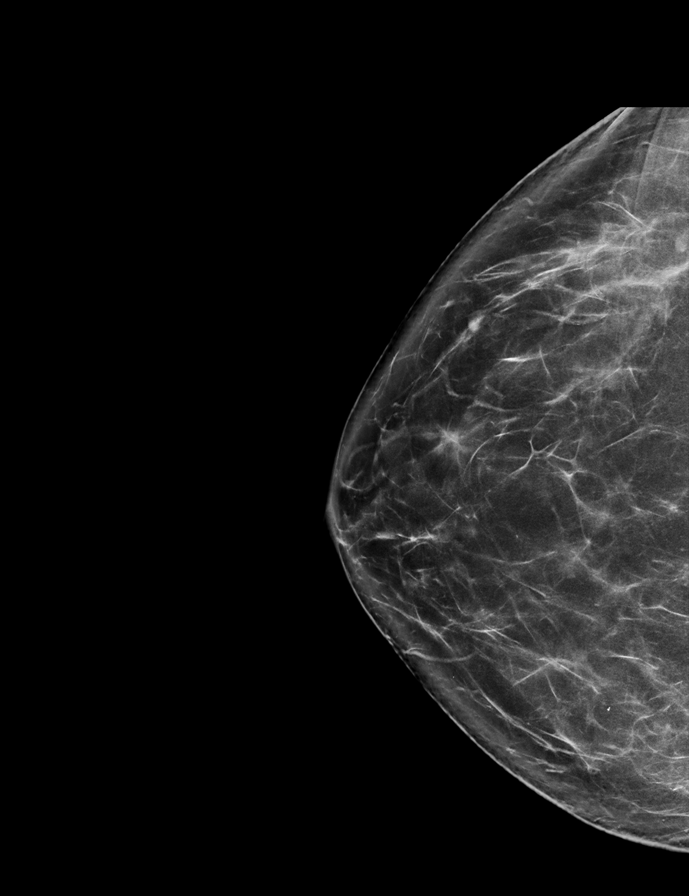

[L MLO tomo · 2 of 72 frames shown]
[frame 24/72]
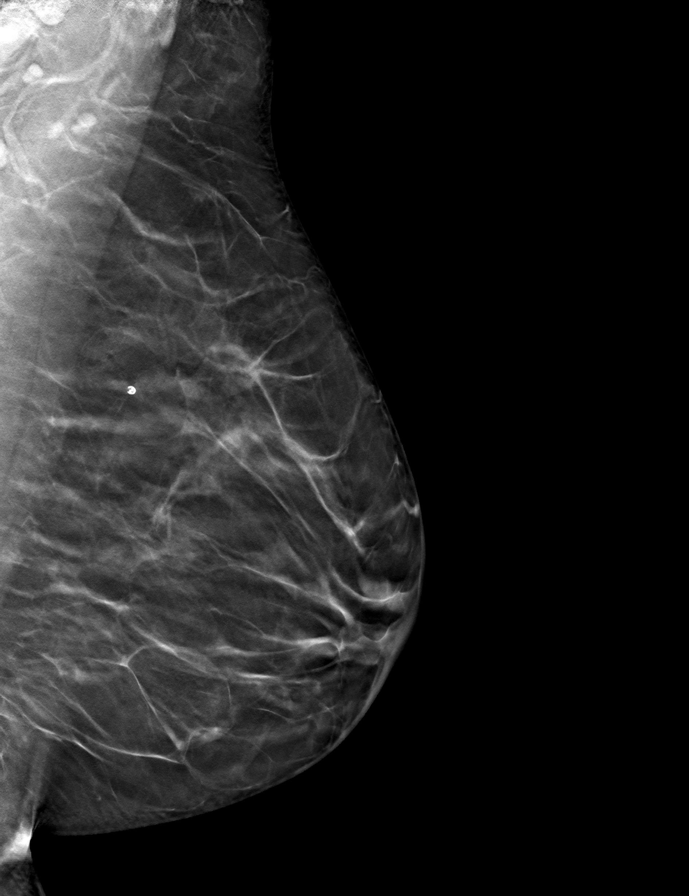
[frame 37/72]
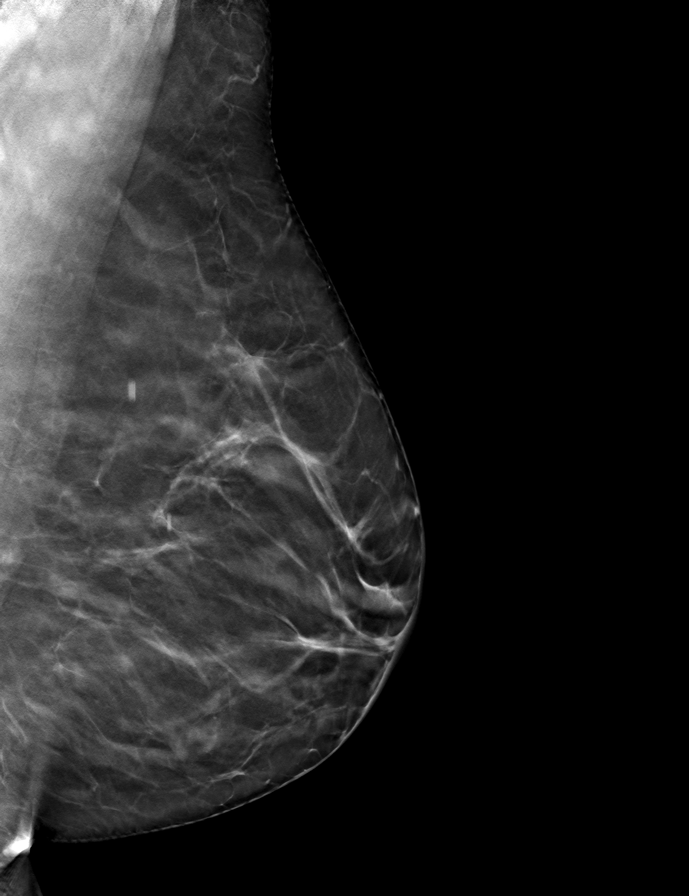

[R MLO tomo · tomo slice 35/69.0]
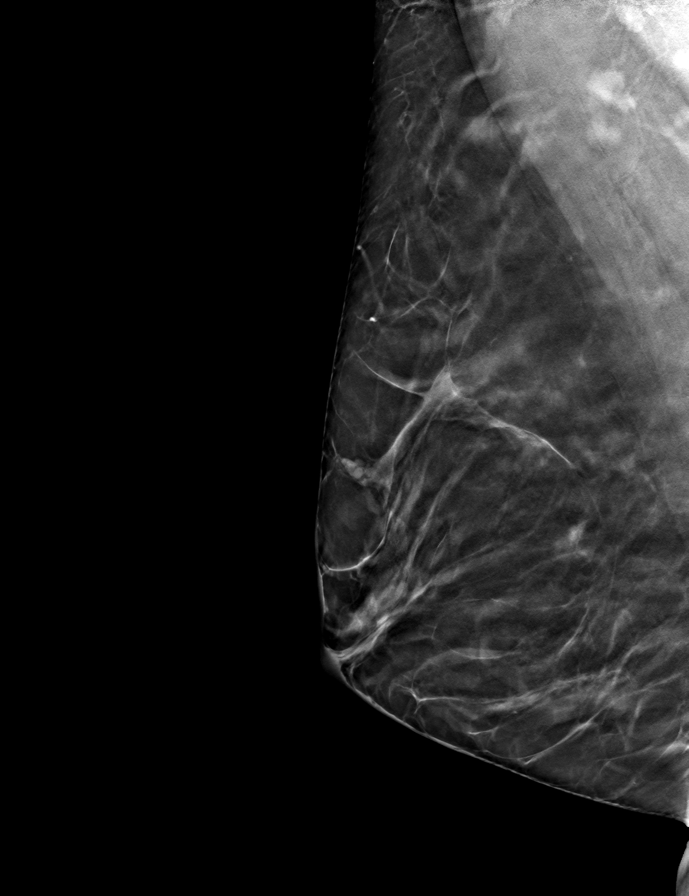

[L CC tomo · tomo slice 35/68.0]
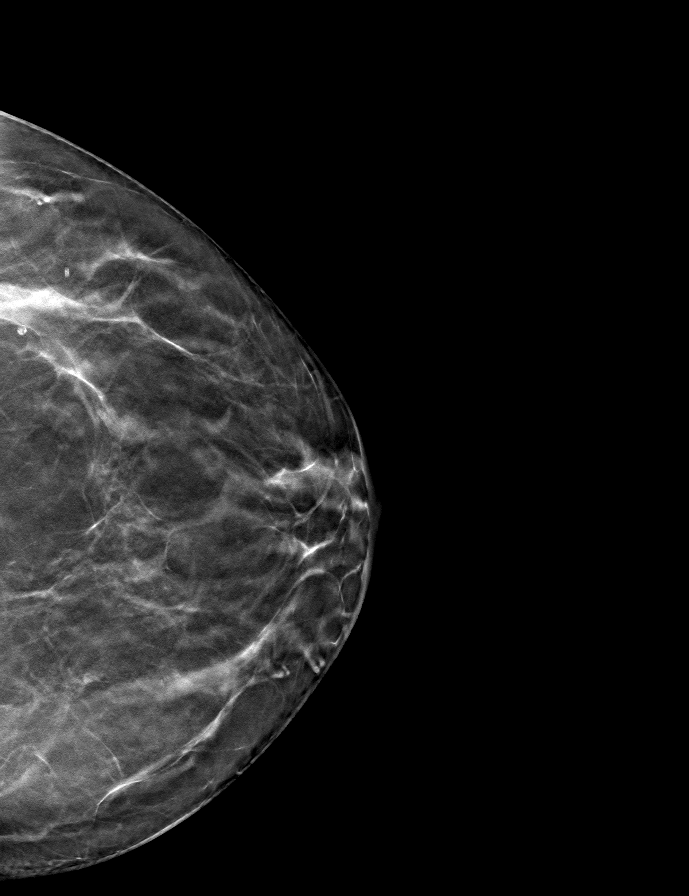

[R CC tomo · tomo slice 39/77.0]
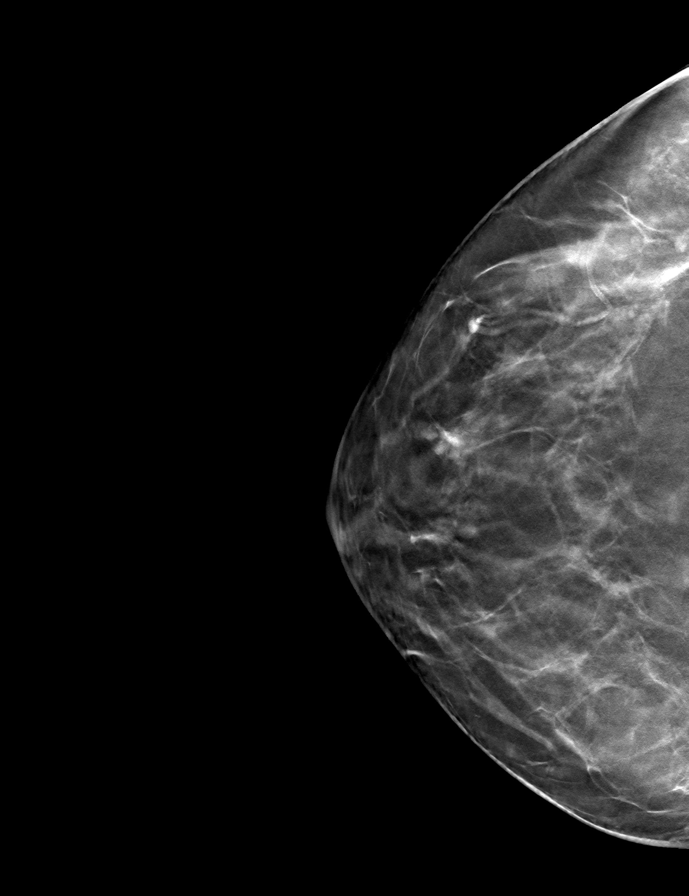

[9 of 24 positions shown; findings below may reference images not displayed]

ACR Breast Density Category b: There are scattered areas of
fibroglandular density.
FINDINGS: There are no findings suspicious for malignancy.
IMPRESSION: No mammographic evidence of malignancy. A result letter of this
screening mammogram will be mailed directly to the patient.

RECOMMENDATION:
Screening mammogram in one year. (Code:51-O-LD2)

BI-RADS CATEGORY  1: Negative.
